# Patient Record
Sex: Male | Born: 1985 | Race: Black or African American | Hispanic: No | Marital: Single | Smoking: Never smoker
Health system: Southern US, Community
[De-identification: ages and names within clinical notes are randomized; demographics above are authoritative.]

## PROBLEM LIST (undated history)

## (undated) HISTORY — PX: KNEE SURGERY: SHX244

---

## 2015-10-24 ENCOUNTER — Encounter (HOSPITAL_COMMUNITY): Payer: Self-pay

## 2015-10-24 ENCOUNTER — Emergency Department (HOSPITAL_COMMUNITY): Payer: BLUE CROSS/BLUE SHIELD

## 2015-10-24 ENCOUNTER — Emergency Department (HOSPITAL_COMMUNITY)
Admission: EM | Admit: 2015-10-24 | Discharge: 2015-10-24 | Disposition: A | Discharge status: Home / Self Care | Payer: BLUE CROSS/BLUE SHIELD | Attending: Emergency Medicine | Admitting: Emergency Medicine

## 2015-10-24 DIAGNOSIS — M5442 Lumbago with sciatica, left side: Secondary | ICD-10-CM | POA: Diagnosis not present

## 2015-10-24 DIAGNOSIS — M545 Low back pain: Secondary | ICD-10-CM | POA: Diagnosis present

## 2015-10-24 MED ORDER — IBUPROFEN 200 MG PO TABS
600.0000 mg | ORAL_TABLET | Freq: Once | ORAL | Status: AC
  Administered 2015-10-24: 600 mg via ORAL
  Filled 2015-10-24: qty 1

## 2015-10-24 NOTE — ED Notes (Signed)
Pt denies any injury or fall

## 2015-10-24 NOTE — ED Notes (Addendum)
Pt reports lower back pain that radiates to Lt leg.

## 2015-10-24 NOTE — ED Provider Notes (Signed)
CSN: [id number]     Arrival date & time 10/24/15  0743 History   First MD Initiated Contact with Patient 10/24/15 0800     Chief Complaint  Patient presents with  . Back Pain    HPI   30 year old male presents today with complaints of back pain. Patient reports history of on and off episodes of acute back pain. Patient notes she's been seen by primary care numerous times, given Flexeril and improvement of his symptoms. Patient reports symptoms resolve on their own. Patient reports today's episode is identical to previous. He notes lower lumbar back pain with radiation down into his left lower extremity. He reports the pain is sharp in nature and shooting down into the leg. He denies any abdominal pain, bowel or bladder incontinence or retention, loss of distal sensation strength or motor function. Patient reports he took Tylenol last night. Patient denies any IV drug use, fever, or any other concerning signs or symptoms.   History reviewed. No pertinent past medical history. Past Surgical History  Procedure Laterality Date  . Knee surgery Left    History reviewed. No pertinent family history. Social History  Substance Use Topics  . Smoking status: Never Smoker   . Smokeless tobacco: Never Used  . Alcohol Use: No    Review of Systems  All other systems reviewed and are negative.   Allergies  Amitriptyline  Home Medications   Prior to Admission medications   Not on File   BP 119/81 mmHg  Pulse 50  Temp(Src) 98.9 F (37.2 C) (Oral)  Resp 16  SpO2 100%   Physical Exam  Constitutional: He is oriented to person, place, and time. He appears well-developed and well-nourished. No distress.  HENT:  Head: Normocephalic and atraumatic.  Eyes: Conjunctivae are normal. Pupils are equal, round, and reactive to light. Right eye exhibits no discharge. Left eye exhibits no discharge. No scleral icterus.  Neck: Normal range of motion. Neck supple. No JVD present. No tracheal deviation  present.  Pulmonary/Chest: Effort normal. No stridor.  Musculoskeletal: Normal range of motion. He exhibits tenderness. He exhibits no edema.  No C, T, or L spine tenderness to palpation. No obvious signs of trauma, deformity, infection, step-offs. Lung expansion normal. No scoliosis or kyphosis. Bilateral lower extremity strength 5 out of 5, sensation grossly intact, patellar reflexes 2+, pedal pulses 2+, Refill less than 3 seconds.  Straight leg negative Ambulates with antalgic gate   Neurological: He is alert and oriented to person, place, and time. Coordination normal.  Skin: Skin is warm and dry. He is not diaphoretic.  Psychiatric: He has a normal mood and affect. His behavior is normal. Judgment and thought content normal.  Nursing note and vitals reviewed.   ED Course  Procedures (including critical care time) Labs Review Labs Reviewed - No data to display  Imaging Review Dg Lumbar Spine Complete  10/24/2015  CLINICAL DATA:  Patient with low back pain for 5 months. No known injury. EXAM: LUMBAR SPINE - COMPLETE 4+ VIEW COMPARISON:  None. FINDINGS: Normal anatomic alignment. No evidence for acute fracture or dislocation. Preservation of vertebral body and intervertebral disc space heights. No significant osseous degenerative changes. SI joints are unremarkable. IMPRESSION: No acute osseous abnormality. Electronically Signed   By: Annia Belt M.D.   On: 10/24/2015 09:36   I have personally reviewed and evaluated these images and lab results as part of my medical decision-making.   EKG Interpretation None      MDM  Final diagnoses:  Bilateral low back pain with left-sided sciatica    Labs:  Imaging: DG lumbar  Consults:  Therapeutics:  Discharge Meds:   Assessment/Plan: 30 year old male presents today with a complicated back pain. Patient has a history of the same. Patient will be instructed to use ibuprofen, ice, back exercises. He will be referred to orthopedics  specialist for further evaluation. Patient is given strict return precautions, she verbalized understanding and agreement today's plan and had no further questions or concerns at time of discharge        Eyvonne MechanicJeffrey Jaiana Sheffer, PA-C 10/24/15 1133  Gerhard Munchobert Lockwood, MD 10/25/15 1752

## 2015-10-24 NOTE — ED Notes (Signed)
Patient complains of lower back pain with pain radiating to left leg with numbness x 1 day, denies trauma

## 2015-10-24 NOTE — Discharge Instructions (Signed)

## 2016-08-20 IMAGING — DX DG LUMBAR SPINE COMPLETE 4+V
5 series · 5 of 5 positions shown · non-contrast
Comparison: None.

CLINICAL DATA: Patient with low back pain for 5 months. No known
injury.

EXAM:
LUMBAR SPINE - COMPLETE 4+ VIEW

[l-spine ap]
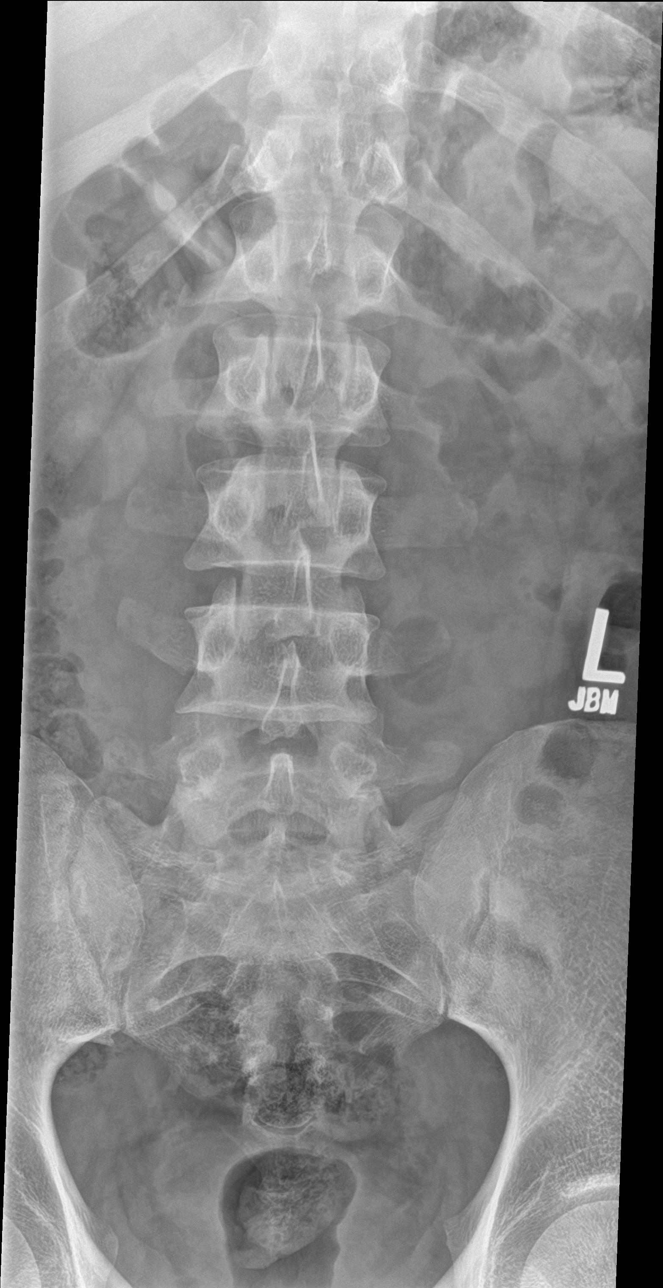

[l-spine obl (1 of 2)]
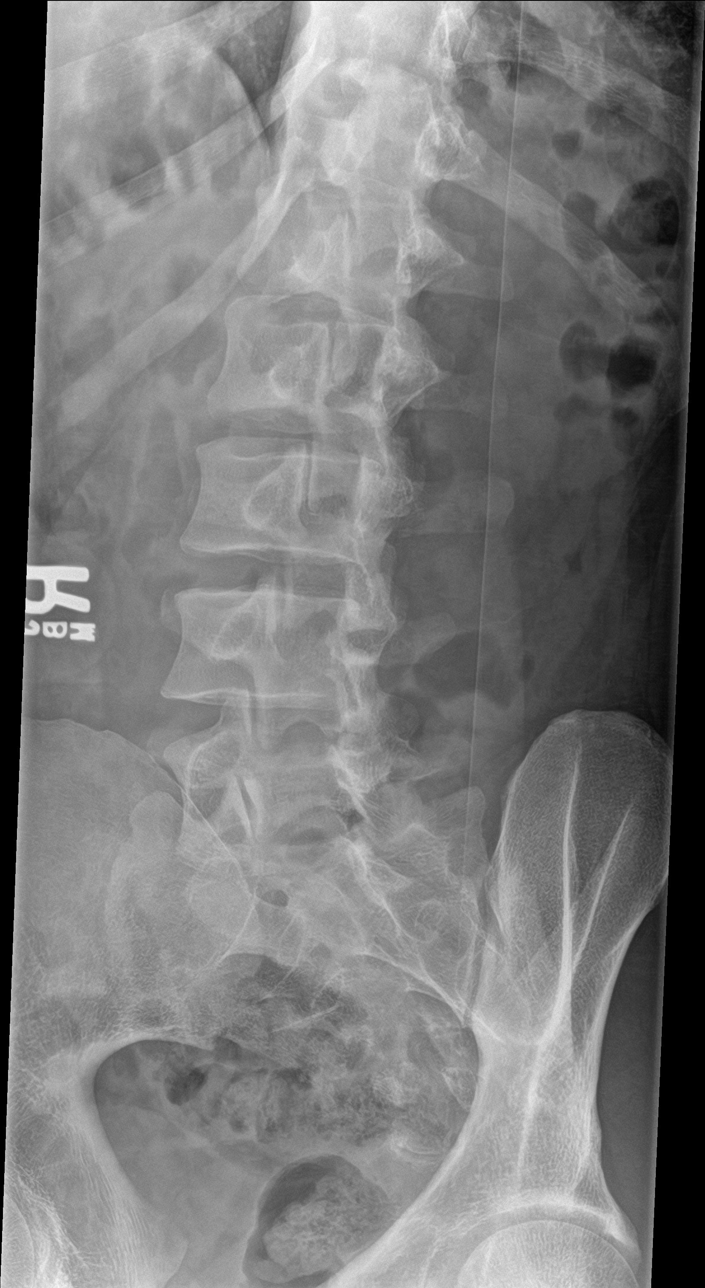

[l-spine obl (2 of 2)]
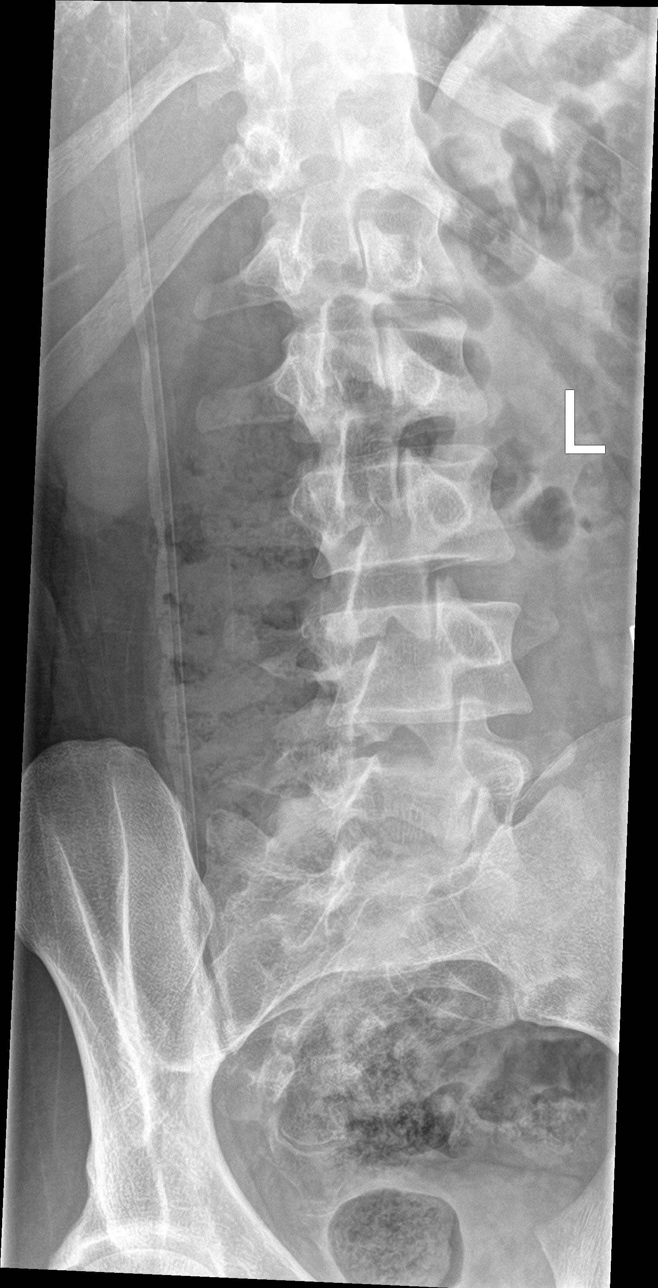

[l-spine lat]
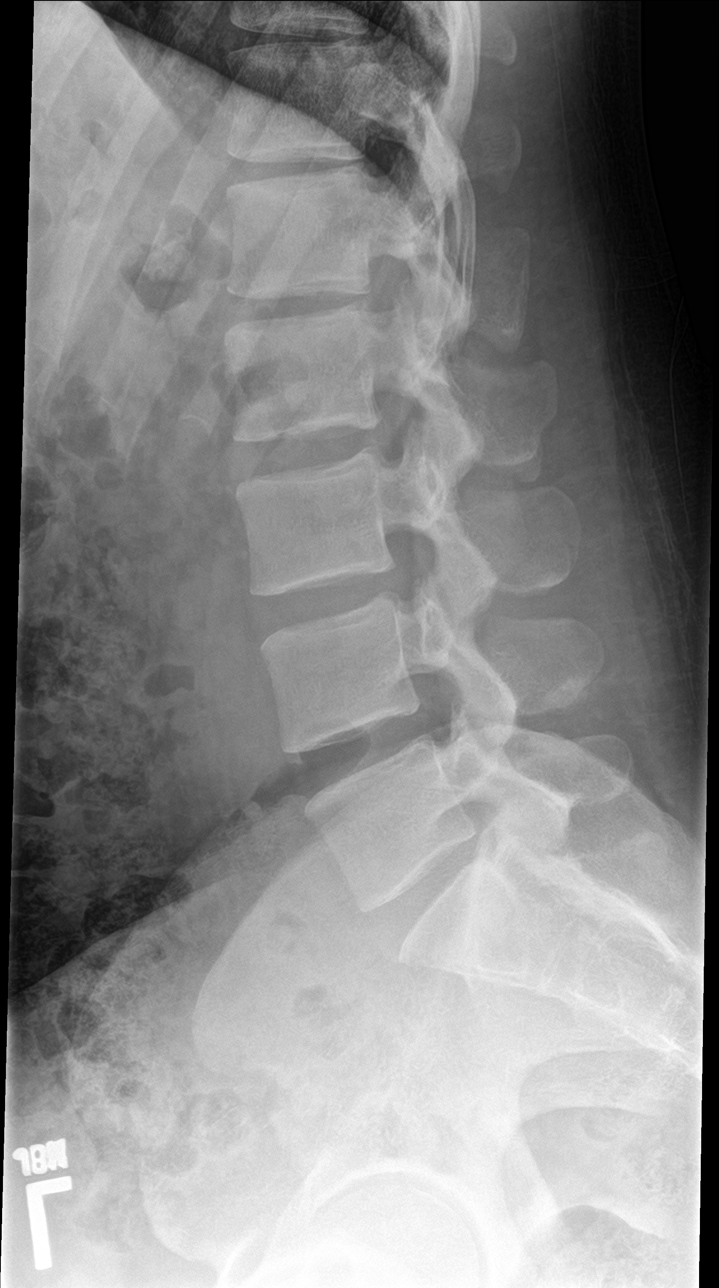

[l-spine spot]
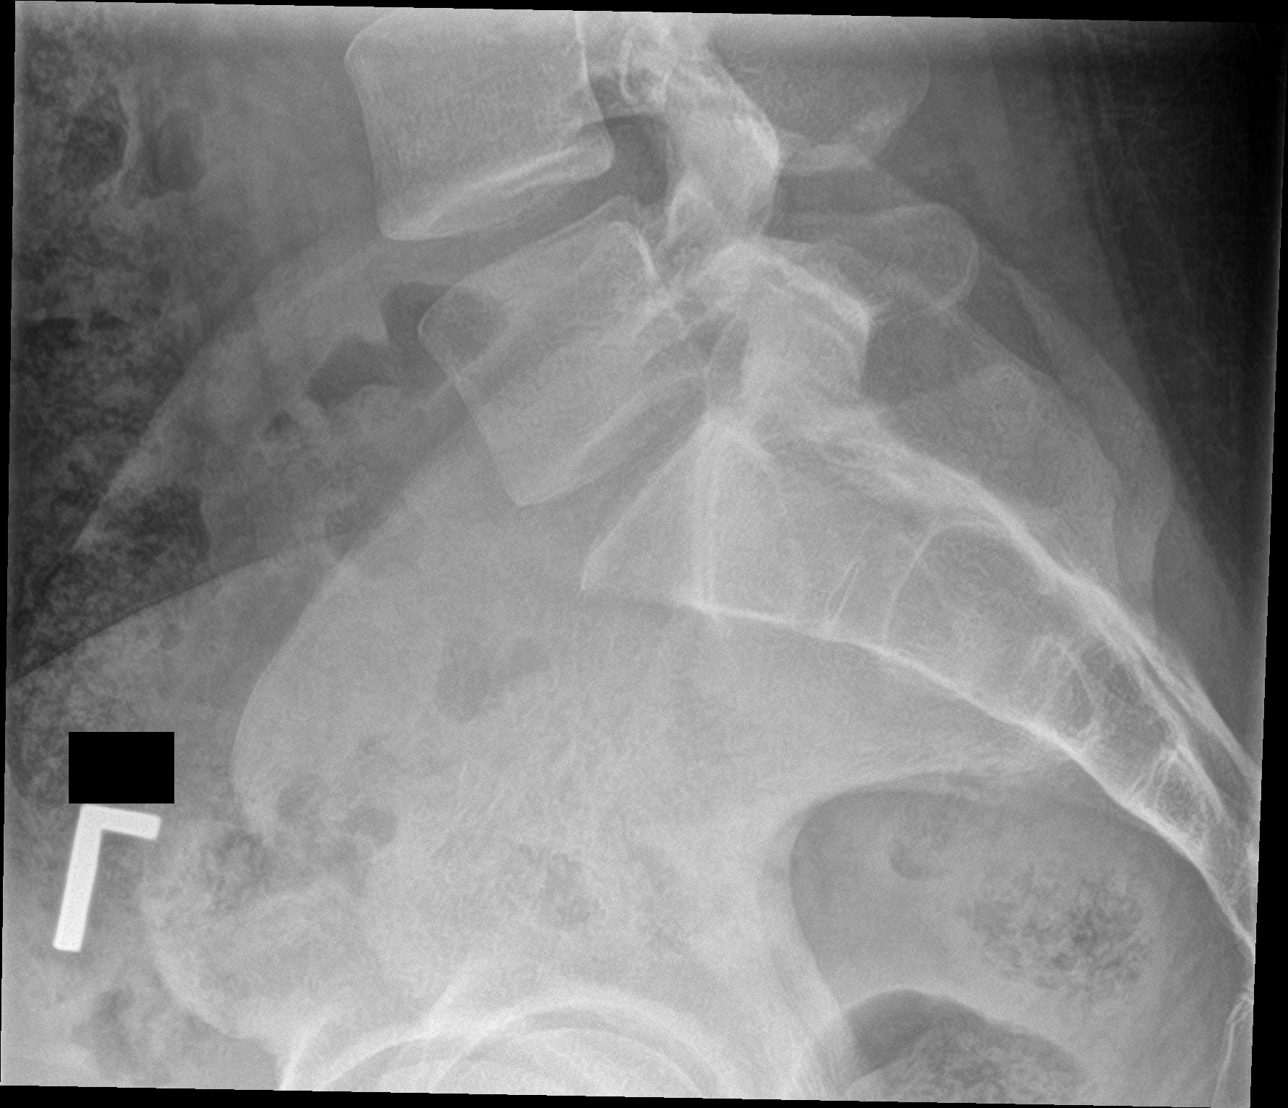

[5 of 5 positions shown; findings below may reference images not displayed]

FINDINGS: Normal anatomic alignment. No evidence for acute fracture or
dislocation. Preservation of vertebral body and intervertebral disc
space heights. No significant osseous degenerative changes. SI
joints are unremarkable.
IMPRESSION: No acute osseous abnormality.

## 2017-06-03 ENCOUNTER — Encounter (HOSPITAL_COMMUNITY): Payer: Self-pay | Admitting: Nurse Practitioner

## 2017-06-03 ENCOUNTER — Emergency Department (HOSPITAL_COMMUNITY)
Admission: EM | Admit: 2017-06-03 | Discharge: 2017-06-03 | Disposition: A | Discharge status: Home / Self Care | Payer: No Typology Code available for payment source | Attending: Emergency Medicine | Admitting: Emergency Medicine

## 2017-06-03 DIAGNOSIS — Y93K9 Activity, other involving animal care: Secondary | ICD-10-CM | POA: Insufficient documentation

## 2017-06-03 DIAGNOSIS — X500XXA Overexertion from strenuous movement or load, initial encounter: Secondary | ICD-10-CM | POA: Insufficient documentation

## 2017-06-03 DIAGNOSIS — M5432 Sciatica, left side: Secondary | ICD-10-CM

## 2017-06-03 DIAGNOSIS — M5441 Lumbago with sciatica, right side: Secondary | ICD-10-CM | POA: Diagnosis not present

## 2017-06-03 DIAGNOSIS — M5442 Lumbago with sciatica, left side: Secondary | ICD-10-CM | POA: Insufficient documentation

## 2017-06-03 DIAGNOSIS — M62830 Muscle spasm of back: Secondary | ICD-10-CM | POA: Insufficient documentation

## 2017-06-03 DIAGNOSIS — M5431 Sciatica, right side: Secondary | ICD-10-CM

## 2017-06-03 DIAGNOSIS — Y999 Unspecified external cause status: Secondary | ICD-10-CM | POA: Diagnosis not present

## 2017-06-03 DIAGNOSIS — Y929 Unspecified place or not applicable: Secondary | ICD-10-CM | POA: Diagnosis not present

## 2017-06-03 DIAGNOSIS — S39012A Strain of muscle, fascia and tendon of lower back, initial encounter: Secondary | ICD-10-CM | POA: Insufficient documentation

## 2017-06-03 DIAGNOSIS — M545 Low back pain: Secondary | ICD-10-CM | POA: Diagnosis present

## 2017-06-03 MED ORDER — METHOCARBAMOL 500 MG PO TABS: 500.0000 mg | ORAL_TABLET | Freq: Every evening | ORAL | 0 refills | Status: AC | PRN

## 2017-06-03 MED ORDER — FENTANYL CITRATE (PF) 100 MCG/2ML IJ SOLN
50.0000 ug | Freq: Once | INTRAMUSCULAR | Status: DC
Start: 2017-06-03 — End: 2017-06-03

## 2017-06-03 MED ORDER — KETOROLAC TROMETHAMINE 60 MG/2ML IM SOLN
30.0000 mg | Freq: Once | INTRAMUSCULAR | Status: AC
  Administered 2017-06-03: 30 mg via INTRAMUSCULAR
  Filled 2017-06-03: qty 2

## 2017-06-03 MED ORDER — NAPROXEN 500 MG PO TABS: 500.0000 mg | ORAL_TABLET | Freq: Two times a day (BID) | ORAL | 0 refills | Status: AC

## 2017-06-03 MED ORDER — FENTANYL CITRATE (PF) 100 MCG/2ML IJ SOLN
50.0000 ug | Freq: Once | INTRAMUSCULAR | Status: AC
  Administered 2017-06-03: 50 ug via INTRAMUSCULAR
  Filled 2017-06-03: qty 2

## 2017-06-03 MED ORDER — OXYCODONE-ACETAMINOPHEN 5-325 MG PO TABS
1.0000 | ORAL_TABLET | Freq: Once | ORAL | Status: AC
  Administered 2017-06-03: 1 via ORAL
  Filled 2017-06-03: qty 1

## 2017-06-03 NOTE — ED Provider Notes (Signed)
[facility]-EMERGENCY DEPT Provider Note   CSN: [id number] Arrival date & time: 06/03/17  1609     History   Chief Complaint Chief Complaint  Patient presents with  . Back Pain    HPI [name] is a 32 y.o. male with no significant past medical history presenting with sudden onset lower back pain radiating down both lower extremities since 10 AM this morning.  He explains that he was on his knees playing with his dog bending over when he felt a sudden sharp pain and was unable to straighten his back fully.  His pain is Belt-like across the lower back region and down into both legs. He has been having difficulties with weightbearing and his pain is greatly aggravated by movement. Tried Aleve without relief.  He said that he tried to sleep it off this morning but woke up and could not withstand the pain.  He had to get his friends assistance to get him in the car and out to the emergency department as he could not walk due to the pain He denies loss of bowel or bladder function, history of IV drug use, fever, chills, history of malignancy.  Patient was in his usual state of health prior to this episode.  HPI  History reviewed. No pertinent past medical history.  There are no active problems to display for this patient.   Past Surgical History:  Procedure Laterality Date  . KNEE SURGERY Left        Home Medications    Prior to Admission medications   Medication Sig Start Date End Date Taking? Authorizing Provider  methocarbamol (ROBAXIN) 500 MG tablet Take 1 tablet (500 mg total) by mouth at bedtime as needed. 06/03/17   Mathews Robinsons B, PA-C  naproxen (NAPROSYN) 500 MG tablet Take 1 tablet (500 mg total) by mouth 2 (two) times daily with a meal. 06/03/17   Georgiana Shore, PA-C    Family History History reviewed. No pertinent family history.  Social History Social History   Tobacco Use  . Smoking status: Never Smoker  . Smokeless  tobacco: Never Used  Substance Use Topics  . Alcohol use: No  . Drug use: No     Allergies   Amitriptyline   Review of Systems Review of Systems  Constitutional: Negative for chills, diaphoresis, fever and unexpected weight change.  Respiratory: Negative for cough, shortness of breath, wheezing and stridor.   Cardiovascular: Negative for chest pain, palpitations and leg swelling.  Gastrointestinal: Negative for abdominal distention, abdominal pain, nausea and vomiting.  Genitourinary: Negative for decreased urine volume, difficulty urinating, dysuria, flank pain, hematuria, scrotal swelling and testicular pain.  Musculoskeletal: Positive for back pain and myalgias. Negative for arthralgias, joint swelling, neck pain and neck stiffness.  Skin: Negative for color change, pallor and rash.  Neurological: Positive for numbness. Negative for seizures and syncope.       Numbing shooting pains in LE bilaterally     Physical Exam Updated Vital Signs BP 129/85 (BP Location: Left Arm)   Pulse 70   Temp 99.7 F (37.6 C) (Oral)   Resp 18   SpO2 100%   Physical Exam  Constitutional: He appears well-developed and well-nourished. No distress.  Afebrile, nontoxic-appearing, sitting in chair in no apparent discomfort.  HENT:  Head: Normocephalic and atraumatic.  Eyes: Conjunctivae are normal.  Neck: Normal range of motion. Neck supple.  Cardiovascular: Normal rate, regular rhythm, normal heart sounds and intact distal pulses.  No murmur heard.  Pulmonary/Chest: Effort normal and breath sounds normal. No stridor. No respiratory distress. He has no wheezes. He has no rales.  Abdominal: He exhibits no distension.  Musculoskeletal: Normal range of motion. He exhibits tenderness. He exhibits no edema or deformity.  Tenderness to palpation of the lower back musculature bilaterally and gluteal regions  Neurological: He is alert. No sensory deficit. He exhibits normal muscle tone.  5/5 strength  in lower extremities bilaterally.  Neurovascularly intact.  Patient attempted to stand up but could not due to pain and unable to assess stance and gait.  Skin: Skin is warm and dry. No rash noted. He is not diaphoretic. No erythema. No pallor.  Psychiatric: He has a normal mood and affect.  Nursing note and vitals reviewed.    ED Treatments / Results  Labs (all labs ordered are listed, but only abnormal results are displayed) Labs Reviewed - No data to display  EKG  EKG Interpretation None       Radiology No results found.  Procedures Procedures (including critical care time)  Medications Ordered in ED Medications  oxyCODONE-acetaminophen (PERCOCET/ROXICET) 5-325 MG per tablet 1 tablet (1 tablet Oral Given 06/03/17 1709)  ketorolac (TORADOL) injection 30 mg (30 mg Intramuscular Given 06/03/17 2004)  fentaNYL (SUBLIMAZE) injection 50 mcg (50 mcg Intramuscular Given 06/03/17 2005)     Initial Impression / Assessment and Plan / ED Course  I have reviewed the triage vital signs and the nursing notes.  Pertinent labs & imaging results that were available during my care of the patient were reviewed by me and considered in my medical decision making (see chart for details).     Patient was provided with analgesia in the emergency experience significant improvement.  On reassessment, patient was up standing and walking around the room and stated that he had much improved.  Patient's presentation and exam findings consistent with lower lumbar strain with sciatica.  Patient presents with lower back pain.  No gross neurological deficits and normal neuro exam.  Patient has no gait abnormality or concern for cauda equina.  No loss of bowel or bladder control, fever, night sweats, weight loss, h/o malignancy, or IVDU.  RICE protocol and pain medications indicated and discussed with patient.   Discharge home with symptomatic relief and close follow-up with PCP.  Provided instructions  for back exercises and activity modification for sciatica.  Discussed strict return precautions and advised to return to the emergency department if experiencing any new or worsening symptoms. Instructions were understood and patient agreed with discharge plan.  Final Clinical Impressions(s) / ED Diagnoses   Final diagnoses:  Strain of lumbar region, initial encounter  Bilateral sciatica  Muscle spasm of back    ED Discharge Orders        Ordered    naproxen (NAPROSYN) 500 MG tablet  2 times daily with meals     06/03/17 2101    methocarbamol (ROBAXIN) 500 MG tablet  At bedtime PRN     06/03/17 2101       Georgiana ShoreMitchell, Kampbell Holaway B, PA-C 06/03/17 2225    Raeford RazorKohut, Stephen, MD 06/03/17 2252

## 2017-06-03 NOTE — Discharge Instructions (Signed)
As discussed, make sure that you modify your exercises throughout the day and do not sit for prolonged periods of time.  The medication prescribed can help with muscle spasm but cannot be taken if driving or operating machinery.  Take naproxen twice a day with food.  Use good posture to protect your lower back and lumbar support.  Follow-up with your primary care provider if symptoms persist beyond a week. Return if symptoms worsen, loss of bowel or bladder function, weakness in any extremity or numbness or other new concerning symptoms in the meantime.

## 2017-06-03 NOTE — ED Triage Notes (Signed)
Pt is c/o sharp low back pain with a sudden onset around 1030 hrs. He denies bowel or bladder control loss but states his legs are numb and the pain persists.
